# Patient Record
Sex: Female | Born: 1980 | Race: White | Hispanic: No | Marital: Married | Smoking: Current every day smoker
Health system: Southern US, Community
[De-identification: ages and names within clinical notes are randomized; demographics above are authoritative.]

## PROBLEM LIST (undated history)

## (undated) HISTORY — PX: TUBAL LIGATION: SHX77

---

## 2007-06-18 ENCOUNTER — Emergency Department: Payer: Self-pay | Admitting: Emergency Medicine

## 2007-06-24 ENCOUNTER — Ambulatory Visit: Payer: Self-pay | Admitting: Unknown Physician Specialty

## 2011-01-08 ENCOUNTER — Ambulatory Visit: Payer: Self-pay | Admitting: Obstetrics and Gynecology

## 2013-02-17 ENCOUNTER — Ambulatory Visit: Payer: Self-pay | Admitting: Obstetrics and Gynecology

## 2013-02-17 LAB — URINALYSIS, COMPLETE
Bacteria: NONE SEEN
Ketone: NEGATIVE
Leukocyte Esterase: NEGATIVE
Protein: NEGATIVE
RBC,UR: 1 /HPF (ref 0–5)
WBC UR: 1 /HPF (ref 0–5)

## 2013-02-17 LAB — HEMOGLOBIN: HGB: 12.5 g/dL (ref 12.0–16.0)

## 2013-02-19 ENCOUNTER — Ambulatory Visit: Payer: Self-pay | Admitting: Obstetrics and Gynecology

## 2015-02-17 NOTE — Op Note (Signed)
PATIENT NAME:  [name], Taylor M MR#:  [id number] DATE OF BIRTH:  01-Sep-1981  DATE OF PROCEDURE:  02/19/2013  PREOPERATIVE DIAGNOSIS: Desires permanent sterilization.   POSTOPERATIVE DIAGNOSES: Desires permanent sterilization, adhesive disease.  PROCEDURES PERFORMED:  1.  Moderate adhesiolysis.  2.  Laparoscopic tubal cautery.   SURGEON: Ricky L. Logan BoresEvans, M.D.   ANESTHESIA: General endotracheal by Dr. Darleene CleaverVan Staveren.   FINDINGS: Grossly normal uterus and ovaries.  Right fallopian tube densely adherent along broad ligament, to the anterior abdominal wall and right pelvic sidewall. Bowel adhesions along the left pelvic brim.   COMPLICATIONS:  None.   ESTIMATED BLOOD LOSS: Minimal.   DRAINS: In and out cath with a red rubber at the beginning of the case, approximately 50 mL of clear urine.   SPECIMENS: None.   PROCEDURE IN DETAIL: The patient was consented, preoperative antibiotics given, taken to the operating room and placed in the supine position where anesthesia was initiated, placed in the dorsal lithotomy position using Allen stirrups, and prepped and draped in the usual sterile fashion. The cervix was visualized. Hulka tenaculum was placed. Bladder was drained. Next, we turned our attention to the abdomen.   A #15 blade was used to make a vertical 1 cm infraumbilical incision through which a 10 mm port was placed and pneumoperitoneum was established. The patient was placed in Trendelenburg position.    Dog-leg scope was placed with findings as noted above.   We were able to visualize the left fallopian tube and it was cauterized over approximately a 2 cm portion in the mid ampullary region until no further current would pass.    We were unable to visualize the right fallopian tube and with alternating light cautery and sharp dissection was able to dissect free what appeared to be a blunted right ovarian tube, which was then cauterized over approximately 4 cm length, taking care to  keep the IP ligament out of the operative field and to stay away from the sidewall. Area was inspected for hemostasis and seemed to be excellent.   The bowel adhesions along the left pelvic brim were then dissected free bluntly and sharply. Pressure was lowered to 6 mmHg.  Areas were seen to be hemostatic. At this point, the procedure was felt to achieve maximum efficacy. Port was removed and pneumoperitoneum was allowed to resolve. The incision was closed with deep of 3-0 Vicryl. Band-Aid was placed.  The patient tolerated the procedure well. 10 mL of 0.5 Sensorcaine was used prior to port placement. All instrument, needle and sponge counts were correct.  Dissection added approximately 12 minutes to this procedure. I anticipate a routine postoperative course.  ____________________________ Reatha Harpsicky L. Logan BoresEvans, MD rle:sb D: 02/19/2013 11:20:06 ET T: 02/19/2013 12:00:36 ET JOB#: [id number]  cc: Ricky L. Logan BoresEvans, MD, <Dictator> Augustina MoodICK L Aaryana Betke MD ELECTRONICALLY SIGNED 02/19/2013 12:43

## 2018-09-03 ENCOUNTER — Ambulatory Visit: Payer: Self-pay | Admitting: Family Medicine

## 2018-09-21 ENCOUNTER — Encounter: Payer: Self-pay | Admitting: Family Medicine

## 2018-09-21 ENCOUNTER — Ambulatory Visit: Payer: No Typology Code available for payment source | Admitting: Family Medicine

## 2018-09-21 VITALS — BP 120/70 | HR 81 | Temp 98.2°F | Resp 16 | Ht 67.0 in | Wt 142.8 lb

## 2018-09-21 DIAGNOSIS — Z23 Encounter for immunization: Secondary | ICD-10-CM | POA: Diagnosis not present

## 2018-09-21 DIAGNOSIS — Z114 Encounter for screening for human immunodeficiency virus [HIV]: Secondary | ICD-10-CM

## 2018-09-21 DIAGNOSIS — R42 Dizziness and giddiness: Secondary | ICD-10-CM

## 2018-09-21 DIAGNOSIS — Z0000 Encounter for general adult medical examination without abnormal findings: Secondary | ICD-10-CM | POA: Diagnosis not present

## 2018-09-21 DIAGNOSIS — Z13220 Encounter for screening for lipoid disorders: Secondary | ICD-10-CM

## 2018-09-21 DIAGNOSIS — L680 Hirsutism: Secondary | ICD-10-CM

## 2018-09-21 DIAGNOSIS — Z720 Tobacco use: Secondary | ICD-10-CM

## 2018-09-21 DIAGNOSIS — Z1159 Encounter for screening for other viral diseases: Secondary | ICD-10-CM

## 2018-09-21 DIAGNOSIS — N946 Dysmenorrhea, unspecified: Secondary | ICD-10-CM

## 2018-09-21 DIAGNOSIS — Z1239 Encounter for other screening for malignant neoplasm of breast: Secondary | ICD-10-CM

## 2018-09-21 NOTE — Patient Instructions (Signed)
Preventive Care 18-39 Years, Female Preventive care refers to lifestyle choices and visits with your health care provider that can promote health and wellness. What does preventive care include?  A yearly physical exam. This is also called an annual well check.  Dental exams once or twice a year.  Routine eye exams. Ask your health care provider how often you should have your eyes checked.  Personal lifestyle choices, including: ? Daily care of your teeth and gums. ? Regular physical activity. ? Eating a healthy diet. ? Avoiding tobacco and drug use. ? Limiting alcohol use. ? Practicing safe sex. ? Taking vitamin and mineral supplements as recommended by your health care provider. What happens during an annual well check? The services and screenings done by your health care provider during your annual well check will depend on your age, overall health, lifestyle risk factors, and family history of disease. Counseling Your health care provider may ask you questions about your:  Alcohol use.  Tobacco use.  Drug use.  Emotional well-being.  Home and relationship well-being.  Sexual activity.  Eating habits.  Work and work Statistician.  Method of birth control.  Menstrual cycle.  Pregnancy history.  Screening You may have the following tests or measurements:  Height, weight, and BMI.  Diabetes screening. This is done by checking your blood sugar (glucose) after you have not eaten for a while (fasting).  Blood pressure.  Lipid and cholesterol levels. These may be checked every 5 years starting at age 66.  Skin check.  Hepatitis C blood test.  Hepatitis B blood test.  Sexually transmitted disease (STD) testing.  BRCA-related cancer screening. This may be done if you have a family history of breast, ovarian, tubal, or peritoneal cancers.  Pelvic exam and Pap test. This may be done every 3 years starting at age 40. Starting at age 59, this may be done every 5  years if you have a Pap test in combination with an HPV test.  Discuss your test results, treatment options, and if necessary, the need for more tests with your health care provider. Vaccines Your health care provider may recommend certain vaccines, such as:  Influenza vaccine. This is recommended every year.  Tetanus, diphtheria, and acellular pertussis (Tdap, Td) vaccine. You may need a Td booster every 10 years.  Varicella vaccine. You may need this if you have not been vaccinated.  HPV vaccine. If you are 69 or younger, you may need three doses over 6 months.  Measles, mumps, and rubella (MMR) vaccine. You may need at least one dose of MMR. You may also need a second dose.  Pneumococcal 13-valent conjugate (PCV13) vaccine. You may need this if you have certain conditions and were not previously vaccinated.  Pneumococcal polysaccharide (PPSV23) vaccine. You may need one or two doses if you smoke cigarettes or if you have certain conditions.  Meningococcal vaccine. One dose is recommended if you are age 27-21 years and a first-year college student living in a residence hall, or if you have one of several medical conditions. You may also need additional booster doses.  Hepatitis A vaccine. You may need this if you have certain conditions or if you travel or work in places where you may be exposed to hepatitis A.  Hepatitis B vaccine. You may need this if you have certain conditions or if you travel or work in places where you may be exposed to hepatitis B.  Haemophilus influenzae type b (Hib) vaccine. You may need this if  you have certain risk factors.  Talk to your health care provider about which screenings and vaccines you need and how often you need them. This information is not intended to replace advice given to you by your health care provider. Make sure you discuss any questions you have with your health care provider. Document Released: 12/10/2001 Document Revised: 07/03/2016  Document Reviewed: 08/15/2015 Elsevier Interactive Patient Education  [employer].

## 2018-09-21 NOTE — Progress Notes (Signed)
Name: Taylor Rush   MRN: [id number]    DOB: 1980-11-29   Date:09/21/2018       Progress Note  Subjective  Chief Complaint  Chief Complaint  Patient presents with  . Establish Care  . Annual Exam    HPI  Patient presents for annual CPE, establish care, and for the following:  Intermittent Lymphadenopathy: She has noticed intermittent swollen lymph node to the RIGHT neck, just below the right ear - it is tender at times, and always goes away after a couple of days.  No recent ear infections, injuries to the area, does have seasonal allergies.  She will trial allegra, flonase and follow up in 3 weeks.   Lightheadedness - she has been getting lightheaded for about 4-6 months - it occurs a few times a week after standing up and walking for about 10 seconds, the episodes last for about 10-20 seconds. She drinks plenty of water, eats fairly regularly. No near-syncope or syncope, no chest pain or shortness of breath.  Diet: Cooks at home, not much fast food; well balanced.  Exercise: Always doing yardwork, etc, but no particular exercise routine  USPSTF grade A and B recommendations    Office Visit from 09/21/2018 in [facility]  AUDIT-C Score  2     Depression: Has been seeing a therapist the last few months; Mom committed suicide 7 years ago and things have recently resurfaced for her.  Declines medication today. Depression screen PHQ 2/9 09/21/2018  Decreased Interest 1  Down, Depressed, Hopeless 1  PHQ - 2 Score 2  Altered sleeping 0  Tired, decreased energy 0  Change in appetite 0  Feeling bad or failure about yourself  1  Trouble concentrating 1  Moving slowly or fidgety/restless 0  Suicidal thoughts 0  PHQ-9 Score 4  Difficult doing work/chores Somewhat difficult   Hypertension: BP Readings from Last 3 Encounters:  09/21/18 120/70   Obesity: Wt Readings from Last 3 Encounters:  09/21/18 142 lb 12.8 oz (64.8 kg)   BMI Readings from Last 3  Encounters:  09/21/18 22.37 kg/m    Hep C Screening: We will check today STD testing and prevention (HIV/chl/gon/syphilis): We will check HIV today; declines others today. Intimate partner violence: No concerns Sexual History/Pain during Intercourse: No concerns Menstrual History/LMP/Abnormal Bleeding: 09/21/2018; she has noticed having pain in the right upper leg on day 2 of her period - the pain starts in her RIGHT lower quadrant/right pubic area and radiates down.  She is taking 427m Advil PRN for this; declines referral to GYN today - will wait until after the holidays Incontinence Symptoms: No concers.  Advanced Care Planning: A voluntary discussion about advance care planning including the explanation and discussion of advance directives.  Discussed health care proxy and Living will, and the patient was able to identify a health care proxy as JPierce Biagini  Patient does not have a living will at present time. If patient does have living will, I have requested they bring this to the clinic to be scanned in to their chart.  Breast cancer: No family history - her mom was adopted.  [facility] BRCA [facility] screening: Not indicated Cervical cancer screening: Due - will come back in 3 weeks.  Osteoporosis Screening: No family history No results found for: HMDEXASCAN  Lipids: No family history of early cardiac death Glucose: We will check today No results found for: GLUCOSE, GLUCAP  Skin cancer: No concerning lesion. Colorectal  cancer: Paternal Grandfather with hx - at age 33. No change sin BM's - no BRB or dark and tarry stools Lung cancer: Smoking 3-4 cigarettes to 1/2ppd. There's always something that happens that is stressful that causes her to go back to smoking.  She declines Wellbutrin or Chantix.  Low Dose CT Chest recommended if Age 40-80 years, 30 pack-year currently smoking OR have quit w/in 15years. Patient does not qualify.   ECG: Denies chest pain, shortness  of breath, or palpitations.   Patient Active Problem List   Diagnosis Date Noted  . Hirsutism 09/21/2018  . Crampy pain associated with menses 09/21/2018  . Tobacco abuse 09/21/2018  . Lipid screening 09/21/2018    Past Surgical History:  Procedure Laterality Date  . CESAREAN SECTION    . TUBAL LIGATION      Family History  Adopted: Yes  Problem Relation Age of Onset  . Depression Mother 46       Committed Suicide  . Healthy Brother   . Alzheimer's disease Paternal Grandmother   . Leukemia Paternal Grandmother   . Colon cancer Paternal Grandfather 49  . Healthy Brother     Social History   Socioeconomic History  . Marital status: Married    Spouse name: Edison Nasuti  . Number of children: 2  . Years of education: Not on file  . Highest education level: Not on file  Occupational History  . Not on file  Social Needs  . Financial resource strain: Not hard at all  . Food insecurity:    Worry: Never true    Inability: Never true  . Transportation needs:    Medical: No    Non-medical: No  Tobacco Use  . Smoking status: Current Every Day Smoker    Packs/day: 0.50    Types: Cigarettes  . Smokeless tobacco: Never Used  Substance and Sexual Activity  . Alcohol use: Yes    Alcohol/week: 6.0 standard drinks    Types: 6 Cans of beer per week    Frequency: Never  . Drug use: Never  . Sexual activity: Yes    Birth control/protection: Surgical  Lifestyle  . Physical activity:    Days per week: 0 days    Minutes per session: 0 min  . Stress: Only a little  Relationships  . Social connections:    Talks on phone: More than three times a week    Gets together: More than three times a week    Attends religious service: Never    Active member of club or organization: No    Attends meetings of clubs or organizations: Never    Relationship status: Married  . Intimate partner violence:    Fear of current or ex partner: No    Emotionally abused: No    Physically abused: No     Forced sexual activity: No  Other Topics Concern  . Not on file  Social History Narrative  . Not on file    No current outpatient medications on file.  Allergies  Allergen Reactions  . Sulfa Antibiotics Hives     ROS  Constitutional: Negative for fever or weight change.  Respiratory: Negative for cough and shortness of breath.   Cardiovascular: Negative for chest pain or palpitations.  Gastrointestinal: Negative for abdominal pain, no bowel changes.  Musculoskeletal: Negative for gait problem or joint swelling.  Skin: Negative for rash.  Neurological: Negative for dizziness or headache.  No other specific complaints in a complete review of systems (except as  listed in HPI above).  Objective  Vitals:   09/21/18 0747  BP: 120/70  Pulse: 81  Resp: 16  Temp: 98.2 F (36.8 C)  TempSrc: Oral  SpO2: 97%  Weight: 142 lb 12.8 oz (64.8 kg)  Height: '5\' 7"'  (1.702 m)    Body mass index is 22.37 kg/m.  Physical Exam Constitutional: Patient appears well-developed and well-nourished. No distress.  HENT: Head: Normocephalic and atraumatic. Ears: B TMs ok, no erythema or effusion; Nose: Nose normal. Mouth/Throat: Oropharynx is clear and moist. No oropharyngeal exudate.  Eyes: Conjunctivae and EOM are normal. Pupils are equal, round, and reactive to light. No scleral icterus.  Neck: Normal range of motion. Neck supple. No JVD present. No thyromegaly present.  Cardiovascular: Normal rate, regular rhythm and normal heart sounds.  No murmur heard. No BLE edema. Pulmonary/Chest: Effort normal and breath sounds normal. No respiratory distress. Abdominal: Soft. Bowel sounds are normal, no distension. There is no tenderness. no masses Breast:  no nipple discharge or rashes; coarse hair around bilateral nipples present; LEFT breast has density to the LEFT lower outer quadrant, otherwise no lumps or masses. FEMALE GENITALIA: Deferred RECTAL: no rectal masses or  hemorrhoids Musculoskeletal: Normal range of motion, no joint effusions. No gross deformities Neurological: he is alert and oriented to person, place, and time. No cranial nerve deficit. Coordination, balance, strength, speech and gait are normal.  Skin: Skin is warm and dry. No rash noted. No erythema.  Psychiatric: Patient has a normal mood and affect. behavior is normal. Judgment and thought content normal.  No results found for this or any previous visit (from the past 2160 hour(s)).  PHQ2/9: Depression screen PHQ 2/9 09/21/2018  Decreased Interest 1  Down, Depressed, Hopeless 1  PHQ - 2 Score 2  Altered sleeping 0  Tired, decreased energy 0  Change in appetite 0  Feeling bad or failure about yourself  1  Trouble concentrating 1  Moving slowly or fidgety/restless 0  Suicidal thoughts 0  PHQ-9 Score 4  Difficult doing work/chores Somewhat difficult   Fall Risk: Fall Risk  09/21/2018  Falls in the past year? 0  Number falls in past yr: 0  Injury with Fall? 0   Assessment & Plan  1. Well woman exam (no gynecological exam) -USPSTF grade A and B recommendations reviewed with patient; age-appropriate recommendations, preventive care, screening tests, etc discussed and encouraged; healthy living encouraged; see AVS for patient education given to patient -Discussed importance of 150 minutes of physical activity weekly, eat two servings of fish weekly, eat one serving of tree nuts ( cashews, pistachios, pecans, almonds.Marland Kitchen) every other day, eat 6 servings of fruit/vegetables daily and drink plenty of water and avoid sweet beverages.  - Insulin, random - COMPLETE METABOLIC PANEL WITH GFR  2. Hirsutism - Insulin, random - COMPLETE METABOLIC PANEL WITH GFR  3. Crampy pain associated with menses - We will consider GYN referral - wants to wait until after the holidays  4. Lipid screening - Lipid panel  5. Tobacco abuse - Counseling provided  6. Encounter for screening for HIV -  HIV Antibody (routine testing w rflx)  7. Need for hepatitis C screening test - Hepatitis C antibody  8. Lightheadedness - Insulin, random - COMPLETE METABOLIC PANEL WITH GFR - TSH - CBC w/Diff/Platelet  9. Need for Tdap vaccination - Tdap vaccine greater than or equal to 7yo IM

## 2018-09-22 ENCOUNTER — Other Ambulatory Visit: Payer: Self-pay | Admitting: Family Medicine

## 2018-09-22 DIAGNOSIS — R922 Inconclusive mammogram: Secondary | ICD-10-CM

## 2018-09-22 LAB — CBC WITH DIFFERENTIAL/PLATELET
BASOS PCT: 0.5 %
Basophils Absolute: 41 cells/uL (ref 0–200)
Eosinophils Absolute: 82 cells/uL (ref 15–500)
Eosinophils Relative: 1 %
HCT: 38.7 % (ref 35.0–45.0)
Hemoglobin: 13.3 g/dL (ref 11.7–15.5)
Lymphs Abs: 1443 cells/uL (ref 850–3900)
MCH: 31.4 pg (ref 27.0–33.0)
MCHC: 34.4 g/dL (ref 32.0–36.0)
MCV: 91.5 fL (ref 80.0–100.0)
MPV: 10.5 fL (ref 7.5–12.5)
Monocytes Relative: 4.9 %
NEUTROS ABS: 6232 {cells}/uL (ref 1500–7800)
Neutrophils Relative %: 76 %
Platelets: 229 10*3/uL (ref 140–400)
RBC: 4.23 10*6/uL (ref 3.80–5.10)
RDW: 11.6 % (ref 11.0–15.0)
Total Lymphocyte: 17.6 %
WBC mixed population: 402 cells/uL (ref 200–950)
WBC: 8.2 10*3/uL (ref 3.8–10.8)

## 2018-09-22 LAB — COMPLETE METABOLIC PANEL WITH GFR
AG RATIO: 1.9 (calc) (ref 1.0–2.5)
ALT: 11 U/L (ref 6–29)
AST: 15 U/L (ref 10–30)
Albumin: 4.6 g/dL (ref 3.6–5.1)
Alkaline phosphatase (APISO): 39 U/L (ref 33–115)
BUN: 12 mg/dL (ref 7–25)
CO2: 26 mmol/L (ref 20–32)
CREATININE: 0.67 mg/dL (ref 0.50–1.10)
Calcium: 9.4 mg/dL (ref 8.6–10.2)
Chloride: 105 mmol/L (ref 98–110)
GFR, EST AFRICAN AMERICAN: 130 mL/min/{1.73_m2} (ref >=60)
GFR, Est Non African American: 112 mL/min/{1.73_m2} (ref >=60)
GLOBULIN: 2.4 g/dL (ref 1.9–3.7)
Glucose, Bld: 81 mg/dL (ref 65–139)
Potassium: 3.9 mmol/L (ref 3.5–5.3)
Sodium: 138 mmol/L (ref 135–146)
Total Bilirubin: 0.8 mg/dL (ref 0.2–1.2)
Total Protein: 7 g/dL (ref 6.1–8.1)

## 2018-09-22 LAB — HEPATITIS C ANTIBODY
Hepatitis C Ab: NONREACTIVE
SIGNAL TO CUT-OFF: 0.05 (ref <1.00)

## 2018-09-22 LAB — LIPID PANEL
CHOL/HDL RATIO: 2.7 (calc) (ref <5.0)
Cholesterol: 167 mg/dL (ref <200)
HDL: 63 mg/dL (ref >50)
LDL Cholesterol (Calc): 89 mg/dL (calc)
Non-HDL Cholesterol (Calc): 104 mg/dL (calc) (ref <130)
TRIGLYCERIDES: 66 mg/dL (ref <150)

## 2018-09-22 LAB — INSULIN, RANDOM: Insulin: <1 u[IU]/mL — ABNORMAL LOW (ref 2.0–19.6)

## 2018-09-22 LAB — TSH: TSH: 1.12 m[IU]/L

## 2018-09-22 LAB — HIV ANTIBODY (ROUTINE TESTING W REFLEX): HIV: NONREACTIVE

## 2018-09-23 ENCOUNTER — Encounter: Payer: Self-pay | Admitting: Family Medicine

## 2018-10-05 ENCOUNTER — Ambulatory Visit
Admission: RE | Admit: 2018-10-05 | Discharge: 2018-10-05 | Disposition: A | Discharge status: Home / Self Care | Payer: No Typology Code available for payment source | Source: Ambulatory Visit | Attending: Family Medicine | Admitting: Family Medicine

## 2018-10-05 DIAGNOSIS — R922 Inconclusive mammogram: Secondary | ICD-10-CM

## 2018-10-20 ENCOUNTER — Encounter: Payer: No Typology Code available for payment source | Admitting: Family Medicine

## 2018-11-17 ENCOUNTER — Ambulatory Visit: Payer: No Typology Code available for payment source | Admitting: Family Medicine

## 2018-11-18 ENCOUNTER — Other Ambulatory Visit (HOSPITAL_COMMUNITY)
Admission: RE | Admit: 2018-11-18 | Discharge: 2018-11-18 | Disposition: A | Discharge status: Home / Self Care | Payer: No Typology Code available for payment source | Source: Ambulatory Visit | Attending: Family Medicine | Admitting: Family Medicine

## 2018-11-18 ENCOUNTER — Encounter: Payer: Self-pay | Admitting: Family Medicine

## 2018-11-18 ENCOUNTER — Ambulatory Visit (INDEPENDENT_AMBULATORY_CARE_PROVIDER_SITE_OTHER): Payer: No Typology Code available for payment source | Admitting: Family Medicine

## 2018-11-18 VITALS — BP 120/80 | HR 85 | Temp 98.1°F | Resp 14 | Ht 66.0 in | Wt 147.0 lb

## 2018-11-18 DIAGNOSIS — Z124 Encounter for screening for malignant neoplasm of cervix: Secondary | ICD-10-CM | POA: Diagnosis present

## 2018-11-18 NOTE — Progress Notes (Signed)
   Name: Taylor Rush   MRN: [id number]    DOB: January 19, 1981   Date:11/18/2018       Progress Note  Subjective  Chief Complaint  Chief Complaint  Patient presents with  . Follow-up    pap smear    HPI  PT presents for pap - had menses during her CPE and returns today for follow up.  No concerns today - no abdominal pain, vaginal discharge or discomfort.   Patient Active Problem List   Diagnosis Date Noted  . Hirsutism 09/21/2018  . Crampy pain associated with menses 09/21/2018  . Tobacco abuse 09/21/2018  . Lipid screening 09/21/2018  . Lightheadedness 09/21/2018    Social History   Tobacco Use  . Smoking status: Current Every Day Smoker    Packs/day: 0.50    Types: Cigarettes  . Smokeless tobacco: Never Used  Substance Use Topics  . Alcohol use: Yes    Alcohol/week: 6.0 standard drinks    Types: 6 Cans of beer per week    Frequency: Never    No current outpatient medications on file.  Allergies  Allergen Reactions  . Sulfa Antibiotics Hives    I personally reviewed active problem list, medication list, allergies with the patient/caregiver today.  ROS  Ten systems reviewed and is negative except as mentioned in HPI  Objective  Vitals:   11/18/18 0742  BP: 120/80  Pulse: 85  Resp: 14  Temp: 98.1 F (36.7 C)  TempSrc: Oral  SpO2: 99%  Weight: 147 lb (66.7 kg)  Height: 5\' 6"  (1.676 m)    Body mass index is 23.73 kg/m.  Nursing Note and Vital Signs reviewed.  Physical Exam  Constitutional: Patient appears well-developed and well-nourished. No distress.  HENT: Head: Normocephalic and atraumatic. Cardiovascular: Normal rate, regular rhythm and normal heart sounds.  No murmur heard. No BLE edema. Pulmonary/Chest: Effort normal and breath sounds normal. No respiratory distress. FEMALE GENITALIA:  External genitalia normal External urethra normal Vaginal vault normal without discharge or lesions Cervix normal without discharge or  lesions Bimanual exam normal without masses Musculoskeletal: Normal range of motion, no joint effusions. No gross deformities Neurological: No focal deficits Skin: Skin is warm and dry. No rash noted. No erythema.  Psychiatric: Patient has a normal mood and affect. behavior is normal. Judgment and thought content normal.   No results found for this or any previous visit (from the past 72 hour(s)).  Assessment & Plan  1. Cervical cancer screening - Cytology - PAP

## 2018-11-23 LAB — CYTOLOGY - PAP
DIAGNOSIS: NEGATIVE
HPV: NOT DETECTED

## 2019-04-14 ENCOUNTER — Encounter: Payer: Self-pay | Admitting: Family Medicine

## 2020-05-10 IMAGING — MG DIGITAL DIAGNOSTIC BILATERAL MAMMOGRAM WITH TOMO AND CAD
4 of 9 series · 4 of 29 positions shown · non-contrast
Comparison: None.

CLINICAL DATA: Palpable lump within the LEFT breast. This is
patient's baseline mammogram.

EXAM:
DIGITAL DIAGNOSTIC BILATERAL MAMMOGRAM WITH CAD AND TOMO
ULTRASOUND LEFT BREAST

[L CC synth-2D (1 of 2)]
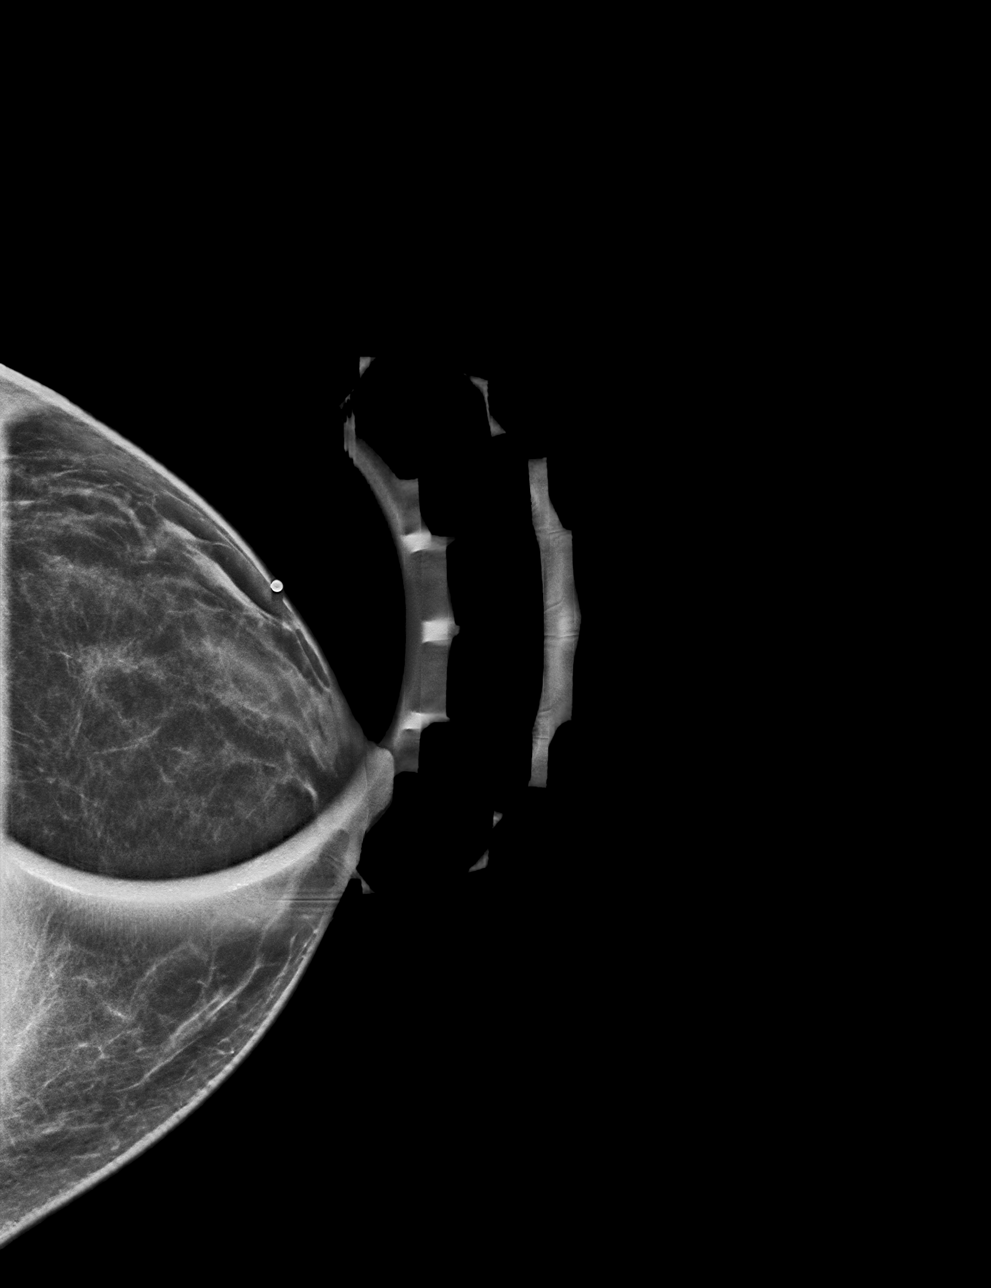

[L CC synth-2D (2 of 2)]
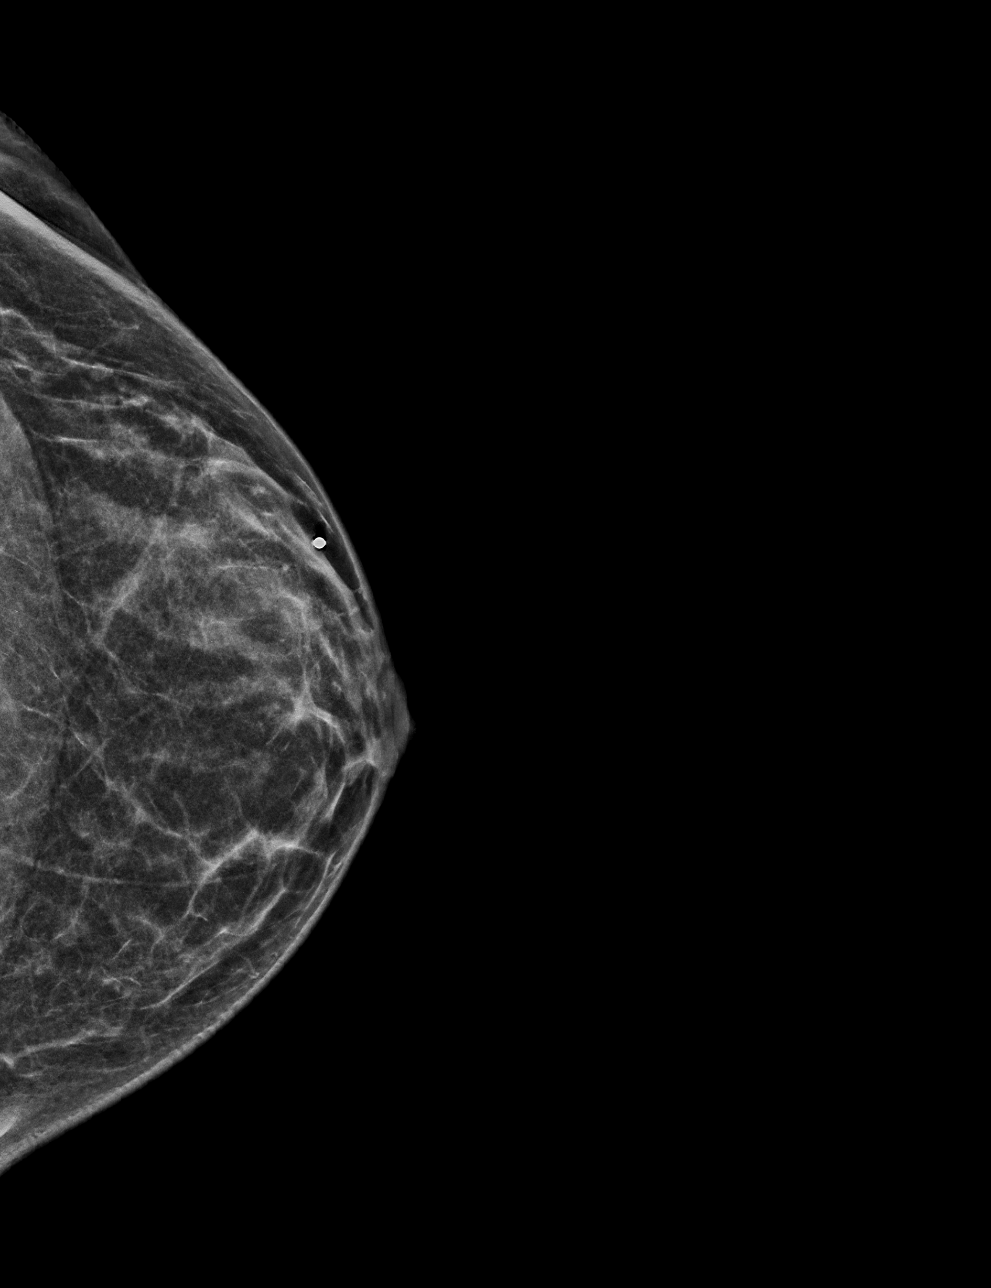

[L ML synth-2D]
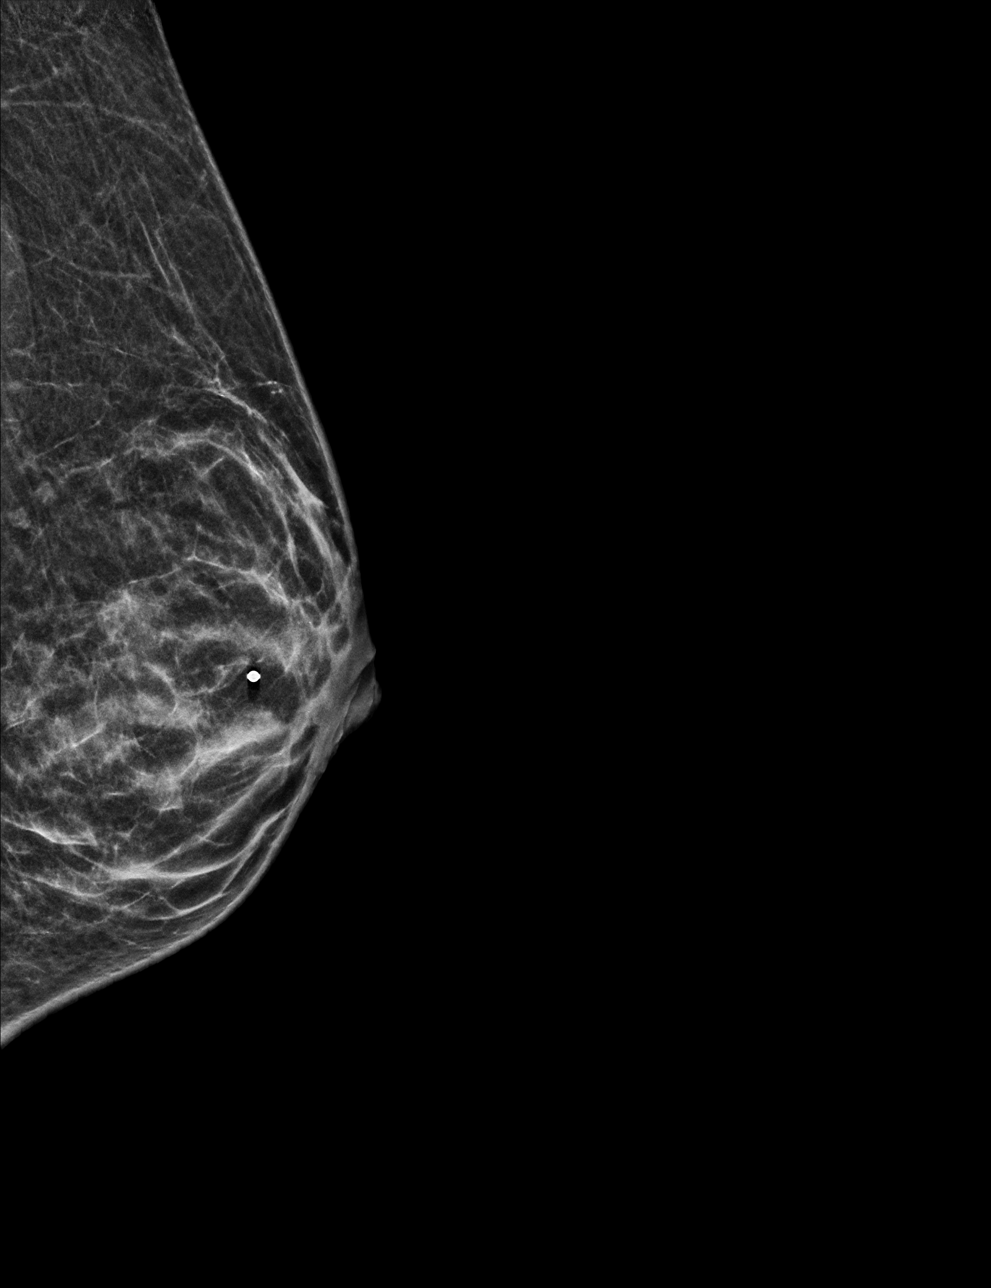

[L MLO synth-2D]
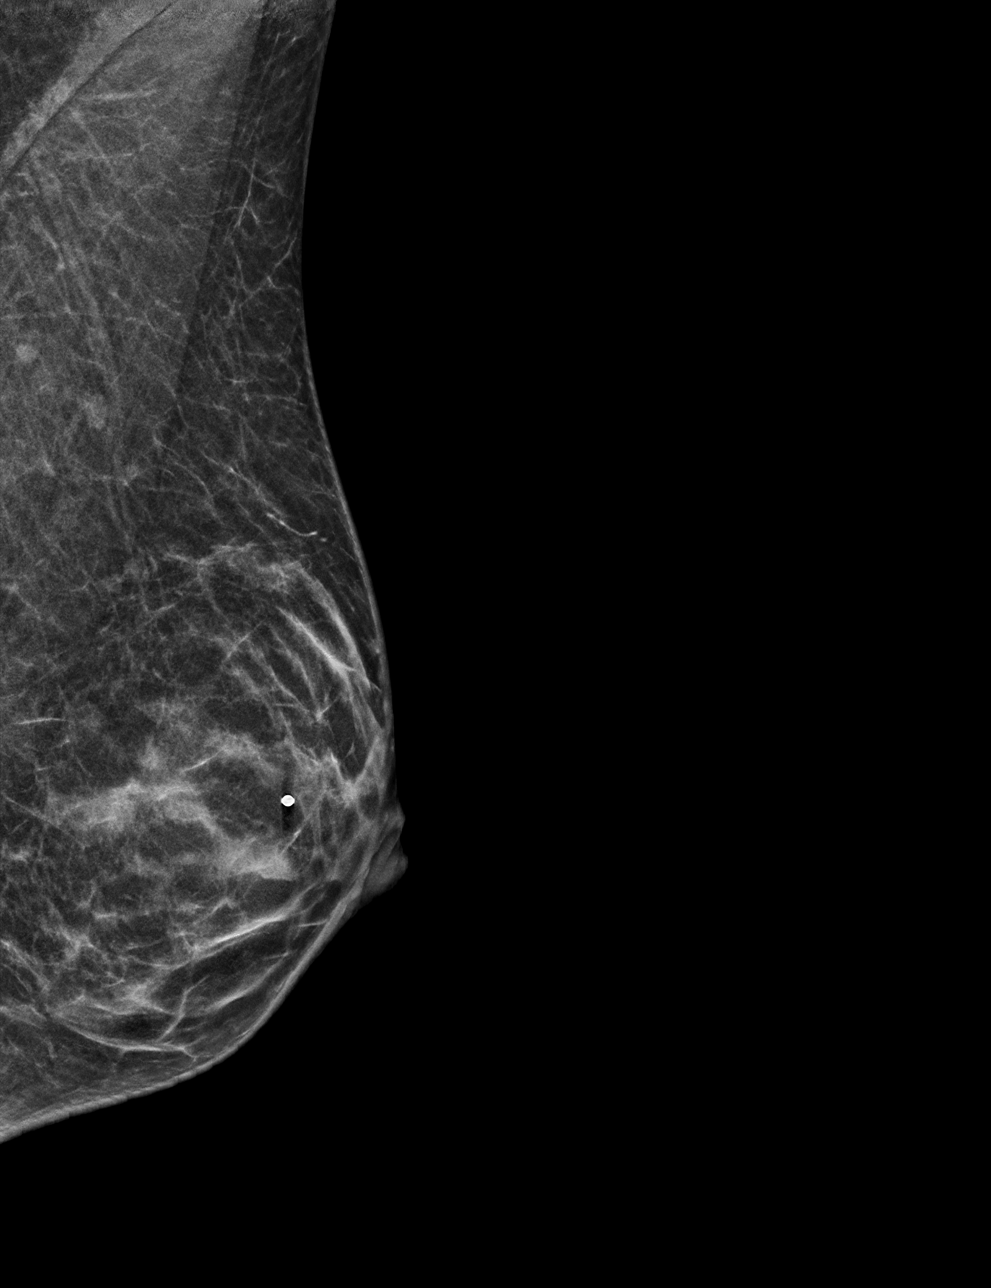

[4 of 29 positions shown; findings below may reference images not displayed]

ACR Breast Density Category b: There are scattered areas of
fibroglandular density.
FINDINGS: Bilateral CC and MLO views were obtained, with additional 3D
tomosynthesis, and with additional spot compression view of the
lower outer quadrant of the LEFT breast corresponding to the area of
clinical concern, with overlying skin marker in place.

There are no dominant masses, suspicious calcifications or secondary
signs of malignancy within either breast. Specifically, there's no
mammographic abnormality within the outer LEFT breast corresponding
to the area of clinical concern.

Mammographic images were processed with CAD.

Targeted ultrasound is performed, evaluating the outer LEFT breast
with particular attention to the 3 o'clock axis as directed by the
patient, showing only normal fibroglandular tissues and fat lobules.
No solid or cystic mass is identified. No skin thickening.
IMPRESSION: No evidence of malignancy within either breast. Specifically, no
evidence of malignancy within the outer LEFT breast corresponding to
the area of clinical concern.

RECOMMENDATION:
1. Screening mammogram at age 40 unless there are persistent or
intervening clinical concerns. (Code:[id number]-[id number]-V8U)
2. The patient was instructed to return sooner if the area that she
feels becomes larger and/or firmer to palpation, or if a new
palpable abnormality is identified in either breast.

I have discussed the findings and recommendations with the patient.
Results were also provided in writing at the conclusion of the
visit. If applicable, a reminder letter will be sent to the patient
regarding the next appointment.

BI-RADS CATEGORY  1: Negative.

## 2021-06-21 ENCOUNTER — Ambulatory Visit: Payer: Self-pay | Admitting: *Deleted

## 2021-06-21 NOTE — Telephone Encounter (Signed)
Called patient to review symptoms. Last seen in clinic 2 years ago . C/o right side of tonsil noted with "white patches" today and right side of neck under jaw swollen , warm to touch. Denies fever, no difficulties swallowing. C/o right ear feels clogged. Pain level 4/10. Please advise to schedule appt due to last provider was E. Annye Asa and patient has not been seen in practice for 2 years. Care advise given. Patient verbalized understanding of care advise and to call back or go to [facility] or ED if symptoms worsen.

## 2021-06-21 NOTE — Telephone Encounter (Signed)
Pt called to report that she just noticed white patches on her tonsils, she thinks she may have tonsilitis. Left side lymph nodes are swollen, no other symptoms. Slight cough.   Best contact: [phone]  Reason for Disposition  Nursing judgment or information in reference  Answer Assessment - Initial Assessment Questions 1. REASON FOR CALL: "What is your main concern right now?"     C/o white patches noted on right side of "tonsils" throat. 2. ONSET: "When did the white patches start?"     Today  3. SEVERITY: "How bad is the swelling to right neck under jaw?"     Not severe, warm to touch  4. FEVER: "Do you have a fever?"     no 5. OTHER SYMPTOMS: "Do you have any other new symptoms?"     Swelling to right side back of throat 6. TREATMENTS AND RESPONSE: "What have you done so far to try to make this better? What medicines have you used?"     Ibuprofen  7. PREGNANCY: "Is there any chance you are pregnant?" "When was your last menstrual period?"     na  Protocols used: No Guideline Available-A-AH

## 2021-06-22 NOTE — Telephone Encounter (Signed)
Informed pt that we are completely booked for today and next week. Informed her about logging into [facility] and seeing a doctor there or going to urgent care. Pt verbalized understanding.
